# Patient Record
Sex: Male | Born: 2000 | Race: Black or African American | Hispanic: No | Marital: Single | Smoking: Never smoker
Health system: Southern US, Community
[De-identification: ages and names within clinical notes are randomized; demographics above are authoritative.]

---
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
## 2023-06-12 ENCOUNTER — Emergency Department (HOSPITAL_BASED_OUTPATIENT_CLINIC_OR_DEPARTMENT_OTHER)
Admission: EM | Admit: 2023-06-12 | Discharge: 2023-06-12 | Disposition: A | Discharge status: Home / Self Care | Payer: Medicaid Other | Attending: Emergency Medicine | Admitting: Emergency Medicine

## 2023-06-12 ENCOUNTER — Other Ambulatory Visit: Payer: Self-pay

## 2023-06-12 ENCOUNTER — Encounter (HOSPITAL_BASED_OUTPATIENT_CLINIC_OR_DEPARTMENT_OTHER): Payer: Self-pay | Admitting: Emergency Medicine

## 2023-06-12 DIAGNOSIS — Y990 Civilian activity done for income or pay: Secondary | ICD-10-CM | POA: Diagnosis not present

## 2023-06-12 DIAGNOSIS — S0993XA Unspecified injury of face, initial encounter: Secondary | ICD-10-CM | POA: Insufficient documentation

## 2023-06-12 DIAGNOSIS — W230XXA Caught, crushed, jammed, or pinched between moving objects, initial encounter: Secondary | ICD-10-CM | POA: Insufficient documentation

## 2023-06-12 MED ORDER — AMOXICILLIN-POT CLAVULANATE 875-125 MG PO TABS: 1.0000 | ORAL_TABLET | Freq: Two times a day (BID) | ORAL | 0 refills | Status: AC

## 2023-06-12 MED ORDER — KETOROLAC TROMETHAMINE 15 MG/ML IJ SOLN
15.0000 mg | Freq: Once | INTRAMUSCULAR | Status: AC
  Administered 2023-06-12: 15 mg via INTRAMUSCULAR
  Filled 2023-06-12: qty 1

## 2023-06-12 MED ORDER — AMOXICILLIN-POT CLAVULANATE 875-125 MG PO TABS
1.0000 | ORAL_TABLET | Freq: Once | ORAL | Status: AC
  Administered 2023-06-12: 1 via ORAL
  Filled 2023-06-12: qty 1

## 2023-06-12 NOTE — ED Notes (Signed)

## 2023-06-12 NOTE — ED Triage Notes (Signed)
Reports a plastic straw was jammed into the left side of his mouth today. No bleeding at this time. Only c/o pain.

## 2023-06-12 NOTE — ED Provider Notes (Signed)
[place] [facility] AT [facility] Provider Note   CSN: [id number] Arrival date & time: 06/12/23  1925     History  Chief Complaint  Patient presents with   Mouth Injury    Christopher Kerr is a 22 y.o. male present after straw was jammed into his mouth.  Patient is he was at work at 2 PM today when a metal object hit his drink which caused his show to go up into his mouth and the left side of his mouth.  Patient states since he has not eaten or had any fluids due to pain when he swallows.  Patient states he has a hole in his mouth in the very back and has not taken any pain meds.  Patient denied jaw pain, headache, vision changes, weakness, bleeding, LOC, change in sensation/motor skills  Home Medications Prior to Admission medications   Medication Sig Start Date End Date Taking? Authorizing Provider  amoxicillin-clavulanate (AUGMENTIN) 875-125 MG tablet Take 1 tablet by mouth every 12 (twelve) hours. 06/12/23  Yes Netta Corrigan, PA-C      Allergies    Patient has no known allergies.    Review of Systems   Review of Systems See HPI Physical Exam Updated Vital Signs BP 126/68 (BP Location: Right Arm)   Pulse 75   Temp 98.4 F (36.9 C) (Oral)   Resp 16   Ht 6\' 2"  (1.88 m)   Wt 65.8 kg   SpO2 100%   BMI 18.62 kg/m  Physical Exam Constitutional:      General: He is not in acute distress. HENT:     Mouth/Throat:     Mouth: Mucous membranes are moist.     Comments: 0.5 cm full in between the left upper hard and soft palate Not actively bleeding Able to tolerate secretions No signs of carotid injury No signs of discharge or foreign body Neck:     Comments: Strong carotid pulse present bilaterally with regular rate Neurological:     Mental Status: He is alert.     ED Results / Procedures / Treatments   Labs (all labs ordered are listed, but only abnormal results are displayed) Labs Reviewed - No data to  display  EKG None  Radiology No results found.  Procedures Procedures    Medications Ordered in ED Medications  ketorolac (TORADOL) 15 MG/ML injection 15 mg (15 mg Intramuscular Given 06/12/23 1948)  amoxicillin-clavulanate (AUGMENTIN) 875-125 MG per tablet 1 tablet (1 tablet Oral Given 06/12/23 2000)    ED Course/ Medical Decision Making/ A&P                             Medical Decision Making Risk Prescription drug management.   Christopher Kerr 22 y.o. presented today for mouth injury. Working DDx that I considered at this time includes, but not limited to, oropharynx injury, carotid injury, neurovascular compromise.  R/o DDx: carotid injury, neurovascular compromise: These are considered less likely due to history of present illness and physical exam findings  Review of prior external notes: 12/04/2011 ED  Unique Tests and My Interpretation: None  Discussion with Independent Historian: None  Discussion of Management of Tests: None  Risk: Medium: prescription drug management  Risk Stratification Score: None  Staffed with [place], DO  Plan: Patient presented for mouth injury. On exam patient was in no acute distress with stable vitals.  On exam patient is tolerating secretions but did have an obvious  hole in the upper left part of his mouth between the hard and soft palate that was not actively bleeding.  Patient good carotid strength and denied any other symptoms besides the pain.  At this time low suspicion of carotid injury and due to hold being in patient's mouth Augmentin will be given before discharge and patient will be given a prescription.  Patient was also encouraged to follow-up with ENT in the next few days if the wound in his mouth does not heal by early next week.  Patient was able to tolerate the Augmentin in the ER and thus passes a p.o. challenge before discharge.  I encouraged patient to take Tylenol ibuprofen or 6 hours needed for pain and to return  if symptoms were to worsen.  I spoke to the patient about having a soft diet and to ensure no food particles get stuck in the hole in the back of his mouth.  Patient was given return precautions. Patient stable for discharge at this time.  Patient verbalized understanding of plan.         Final Clinical Impression(s) / ED Diagnoses Final diagnoses:  Injury of mouth, initial encounter    Rx / DC Orders ED Discharge Orders          Ordered    amoxicillin-clavulanate (AUGMENTIN) 875-125 MG tablet  Every 12 hours        06/12/23 2005              Remi Deter 06/12/23 2010    Virgina Norfolk, DO 06/12/23 2019

## 2023-06-12 NOTE — Discharge Instructions (Signed)
Please pick up the antibiotics I prescribed for you.  The injury in your mouth should resolve in the next few days however if it does not resolve by Tuesday of next week please follow-up with the ENT specialist.  Please avoid coarse foods or large meals to allow your mouth to heal.  You may use a liquid diet, ground up food, smaller quantities of food in the meantime.  If symptoms are to change or worsen please return to ER.
